# Patient Record
Sex: Male | Born: 1995 | Race: White | Hispanic: No | Marital: Single | Smoking: Never smoker
Health system: Southern US, Community
[De-identification: ages and names within clinical notes are randomized; demographics above are authoritative.]

---
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
## 2015-05-28 ENCOUNTER — Emergency Department (HOSPITAL_COMMUNITY): Payer: Self-pay

## 2015-05-28 ENCOUNTER — Encounter (HOSPITAL_COMMUNITY): Payer: Self-pay | Admitting: Emergency Medicine

## 2015-05-28 ENCOUNTER — Emergency Department (HOSPITAL_COMMUNITY)
Admission: EM | Admit: 2015-05-28 | Discharge: 2015-05-28 | Disposition: A | Discharge status: Home / Self Care | Payer: Self-pay | Attending: Emergency Medicine | Admitting: Emergency Medicine

## 2015-05-28 DIAGNOSIS — Y998 Other external cause status: Secondary | ICD-10-CM | POA: Insufficient documentation

## 2015-05-28 DIAGNOSIS — Y9389 Activity, other specified: Secondary | ICD-10-CM | POA: Insufficient documentation

## 2015-05-28 DIAGNOSIS — S8254XA Nondisplaced fracture of medial malleolus of right tibia, initial encounter for closed fracture: Secondary | ICD-10-CM | POA: Insufficient documentation

## 2015-05-28 DIAGNOSIS — S8251XA Displaced fracture of medial malleolus of right tibia, initial encounter for closed fracture: Secondary | ICD-10-CM

## 2015-05-28 DIAGNOSIS — Z792 Long term (current) use of antibiotics: Secondary | ICD-10-CM | POA: Insufficient documentation

## 2015-05-28 DIAGNOSIS — Y92410 Unspecified street and highway as the place of occurrence of the external cause: Secondary | ICD-10-CM | POA: Insufficient documentation

## 2015-05-28 MED ORDER — NAPROXEN 250 MG PO TABS: 250.0000 mg | ORAL_TABLET | Freq: Two times a day (BID) | ORAL | Status: AC

## 2015-05-28 NOTE — Discharge Instructions (Signed)
Ankle Fracture A fracture is a break in a bone. The ankle joint is made up of three bones. These include the lower (distal)sections of your lower leg bones, called the tibia and fibula, along with a bone in your foot, called the talus. Depending on how bad the break is and if more than one ankle joint bone is broken, a cast or splint is used to protect and keep your injured bone from moving while it heals. Sometimes, surgery is required to help the fracture heal properly.  There are two general types of fractures:  Stable fracture. This includes a single fracture line through one bone, with no injury to ankle ligaments. A fracture of the talus that does not have any displacement (movement of the bone on either side of the fracture line) is also stable.  Unstable fracture. This includes more than one fracture line through one or more bones in the ankle joint. It also includes fractures that have displacement of the bone on either side of the fracture line. CAUSES  A direct blow to the ankle.   Quickly and severely twisting your ankle.  Trauma, such as a car accident or falling from a significant height. RISK FACTORS You may be at a higher risk of ankle fracture if:  You have certain medical conditions.  You are involved in high-impact sports.  You are involved in a high-impact car accident. SIGNS AND SYMPTOMS  1. Tender and swollen ankle. 2. Bruising around the injured ankle. 3. Pain on movement of the ankle. 4. Difficulty walking or putting weight on the ankle. 5. A cold foot below the site of the ankle injury. This can occur if the blood vessels passing through your injured ankle were also damaged. 6. Numbness in the foot below the site of the ankle injury. DIAGNOSIS  An ankle fracture is usually diagnosed with a physical exam and X-rays. A CT scan may also be required for complex fractures. TREATMENT  Stable fractures are treated with a cast or splint and using crutches to avoid  putting weight on your injured ankle. This is followed by an ankle strengthening program. Some patients require a special type of cast, depending on other medical problems they may have. Unstable fractures require surgery to ensure the bones heal properly. Your health care provider will tell you what type of fracture you have and the best treatment for your condition. HOME CARE INSTRUCTIONS  1. Review correct crutch use with your health care provider and use your crutches as directed. Safe use of crutches is extremely important. Misuse of crutches can cause you to fall or cause injury to nerves in your hands or armpits. 2. Do not put weight or pressure on the injured ankle until directed by your health care provider. 3. To lessen the swelling, keep the injured leg elevated while sitting or lying down. 4. Apply ice to the injured area: 1. Put ice in a plastic bag. 2. Place a towel between your cast and the bag. 3. Leave the ice on for 20 minutes, 2-3 times a day. 5. If you have a plaster or fiberglass cast: 1. Do not try to scratch the skin under the cast with any objects. This can increase your risk of skin infection. 2. Check the skin around the cast every day. You may put lotion on any red or sore areas. 3. Keep your cast dry and clean. 6. If you have a plaster splint: 1. Wear the splint as directed. 2. You may loosen the elastic  around the splint if your toes become numb, tingle, or turn cold or blue. 7. Do not put pressure on any part of your cast or splint; it may break. Rest your cast only on a pillow the first 24 hours until it is fully hardened. 8. Your cast or splint can be protected during bathing with a plastic bag sealed to your skin with medical tape. Do not lower the cast or splint into water. 9. Take medicines as directed by your health care provider. Only take over-the-counter or prescription medicines for pain, discomfort, or fever as directed by your health care provider. 10. Do  not drive a vehicle until your health care provider specifically tells you it is safe to do so. 11. If your health care provider has given you a follow-up appointment, it is very important to keep that appointment. Not keeping the appointment could result in a chronic or permanent injury, pain, and disability. If you have any problem keeping the appointment, call the facility for assistance. SEEK MEDICAL CARE IF: You develop increased swelling or discomfort. SEEK IMMEDIATE MEDICAL CARE IF:  1. Your cast gets damaged or breaks. 2. You have continued severe pain. 3. You develop new pain or swelling after the cast was put on. 4. Your skin or toenails below the injury turn blue or gray. 5. Your skin or toenails below the injury feel cold, numb, or have loss of sensitivity to touch. 6. There is a bad smell or pus draining from under the cast. MAKE SURE YOU:  1. Understand these instructions. 2. Will watch your condition. 3. Will get help right away if you are not doing well or get worse.   This information is not intended to replace advice given to you by your health care provider. Make sure you discuss any questions you have with your health care provider.   Document Released: 03/01/2000 Document Revised: 03/09/2013 Document Reviewed: 10/01/2012 Elsevier Interactive Patient Education 2016 [employer] Crutch Use Crutches are used to take weight off one of your legs or feet when you stand or walk. It is important to use crutches that fit properly. When fitted properly:  Each crutch should be 2-3 finger widths below the armpit.  Your weight should be supported by your hand, and not by resting the armpit on the crutch. RISKS AND COMPLICATIONS Damage to the nerves that extend from your armpit to your hand and arm. To prevent this from happening, make sure your crutches fit properly and do not put pressure on your armpit when using them. HOW TO USE YOUR CRUTCHES If you have been instructed to  use partial weight bearing, apply (bear) the amount of weight as your health care provider suggests. Do not bear weight in an amount that causes pain to the area of injury. Walking  Step with the crutches.  Swing the healthy leg slightly ahead of the crutches. Going Up Steps If there is no handrail:  Step up with the healthy leg.  Step up with the crutches and injured leg.  Continue in this way. If there is a handrail: 7. Hold both crutches in one hand. 8. Place your free hand on the handrail. 9. While putting your weight on your arms, lift your healthy leg to the step. 10. Bring the crutches and the injured leg up to that step. 11. Continue in this way. Going Down Steps Be very careful, as going down stairs with crutches is very challenging. If there is no handrail: 12. Step down with the  injured leg and crutches. 13. Step down with the healthy leg. If there is a handrail: 7. Place your hand on the handrail. 8. Hold both crutches with your free hand. 9. Lower your injured leg and crutch to the step below you. Make sure to keep the crutch tips in the center of the step, never on the edge. 10. Lower your healthy leg to that step. 11. Continue in this way. Standing Up 4. Hold the injured leg forward. 5. Grab the armrest with one hand and the top of the crutches with the other hand. 6. Using these supports, pull yourself up to a standing position. Sitting Down 1. Hold the injured leg forward. 2. Grab the armrest with one hand and the top of the crutches with the other hand. 3. Lower yourself to a sitting position. SEEK MEDICAL CARE IF:  You still feel unsteady on your feet.  You develop new pain, for example in your armpits, back, shoulder, wrist, or hip.  You develop any numbness or tingling. SEEK IMMEDIATE MEDICAL CARE IF:  You fall.   This information is not intended to replace advice given to you by your health care provider. Make sure you discuss any questions you  have with your health care provider.   Document Released: 03/01/2000 Document Revised: 03/25/2014 Document Reviewed: 11/09/2012 Elsevier Interactive Patient Education 2016 [employer]  Cast or Splint Care Casts and splints support injured limbs and keep bones from moving while they heal. It is important to care for your cast or splint at home.  HOME CARE INSTRUCTIONS  Keep the cast or splint uncovered during the drying period. It can take 24 to 48 hours to dry if it is made of plaster. A fiberglass cast will dry in less than 1 hour.  Do not rest the cast on anything harder than a pillow for the first 24 hours.  Do not put weight on your injured limb or apply pressure to the cast until your health care provider gives you permission.  Keep the cast or splint dry. Wet casts or splints can lose their shape and may not support the limb as well. A wet cast that has lost its shape can also create harmful pressure on your skin when it dries. Also, wet skin can become infected.  Cover the cast or splint with a plastic bag when bathing or when out in the rain or snow. If the cast is on the trunk of the body, take sponge baths until the cast is removed.  If your cast does become wet, dry it with a towel or a blow dryer on the cool setting only.  Keep your cast or splint clean. Soiled casts may be wiped with a moistened cloth.  Do not place any hard or soft foreign objects under your cast or splint, such as cotton, toilet paper, lotion, or powder.  Do not try to scratch the skin under the cast with any object. The object could get stuck inside the cast. Also, scratching could lead to an infection. If itching is a problem, use a blow dryer on a cool setting to relieve discomfort.  Do not trim or cut your cast or remove padding from inside of it.  Exercise all joints next to the injury that are not immobilized by the cast or splint. For example, if you have a long leg cast, exercise the hip joint  and toes. If you have an arm cast or splint, exercise the shoulder, elbow, thumb, and fingers.  Elevate  your injured arm or leg on 1 or 2 pillows for the first 1 to 3 days to decrease swelling and pain.It is best if you can comfortably elevate your cast so it is higher than your heart. SEEK MEDICAL CARE IF:   Your cast or splint cracks.  Your cast or splint is too tight or too loose.  You have unbearable itching inside the cast.  Your cast becomes wet or develops a soft spot or area.  You have a bad smell coming from inside your cast.  You get an object stuck under your cast.  Your skin around the cast becomes red or raw.  You have new pain or worsening pain after the cast has been applied. SEEK IMMEDIATE MEDICAL CARE IF:   You have fluid leaking through the cast.  You are unable to move your fingers or toes.  You have discolored (blue or white), cool, painful, or very swollen fingers or toes beyond the cast.  You have tingling or numbness around the injured area.  You have severe pain or pressure under the cast.  You have any difficulty with your breathing or have shortness of breath.  You have chest pain.   This information is not intended to replace advice given to you by your health care provider. Make sure you discuss any questions you have with your health care provider.   Document Released: 03/01/2000 Document Revised: 12/23/2012 Document Reviewed: 09/10/2012 Elsevier Interactive Patient Education [employer]
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
## 2017-12-11 ENCOUNTER — Emergency Department: Admission: EM | Admit: 2017-12-11 | Discharge: 2017-12-11 | Discharge status: Absent without leave | Payer: Self-pay
